# Patient Record
Sex: Female | Born: 1995 | Race: White | Hispanic: No | Marital: Single | Smoking: Current every day smoker
Health system: Southern US, Community
[De-identification: ages and names within clinical notes are randomized; demographics above are authoritative.]

## PROBLEM LIST (undated history)

## (undated) DIAGNOSIS — N83201 Unspecified ovarian cyst, right side: Secondary | ICD-10-CM

## (undated) HISTORY — PX: TONSILLECTOMY: SUR1361

---
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
## 2018-05-18 ENCOUNTER — Encounter (HOSPITAL_COMMUNITY): Payer: Self-pay | Admitting: Emergency Medicine

## 2018-05-18 ENCOUNTER — Emergency Department (HOSPITAL_COMMUNITY)
Admission: EM | Admit: 2018-05-18 | Discharge: 2018-05-19 | Disposition: A | Discharge status: Home / Self Care | Payer: Self-pay | Attending: Emergency Medicine | Admitting: Emergency Medicine

## 2018-05-18 ENCOUNTER — Other Ambulatory Visit: Payer: Self-pay

## 2018-05-18 DIAGNOSIS — F17210 Nicotine dependence, cigarettes, uncomplicated: Secondary | ICD-10-CM | POA: Insufficient documentation

## 2018-05-18 DIAGNOSIS — R112 Nausea with vomiting, unspecified: Secondary | ICD-10-CM | POA: Insufficient documentation

## 2018-05-18 LAB — CBC
HEMATOCRIT: 39.3 % (ref 36.0–46.0)
HEMOGLOBIN: 13.5 g/dL (ref 12.0–15.0)
MCH: 27.6 pg (ref 26.0–34.0)
MCHC: 34.4 g/dL (ref 30.0–36.0)
MCV: 80.4 fL (ref 78.0–100.0)
Platelets: 217 10*3/uL (ref 150–400)
RBC: 4.89 MIL/uL (ref 3.87–5.11)
RDW: 14.1 % (ref 11.5–15.5)
WBC: 7 10*3/uL (ref 4.0–10.5)

## 2018-05-18 LAB — URINALYSIS, ROUTINE W REFLEX MICROSCOPIC
Bilirubin Urine: NEGATIVE
GLUCOSE, UA: NEGATIVE mg/dL
HGB URINE DIPSTICK: NEGATIVE
Ketones, ur: NEGATIVE mg/dL
Leukocytes, UA: NEGATIVE
Nitrite: NEGATIVE
PH: 6 (ref 5.0–8.0)
Protein, ur: NEGATIVE mg/dL
SPECIFIC GRAVITY, URINE: 1.023 (ref 1.005–1.030)

## 2018-05-18 LAB — PREGNANCY, URINE: Preg Test, Ur: NEGATIVE

## 2018-05-18 MED ORDER — ONDANSETRON HCL 4 MG/2ML IJ SOLN
4.0000 mg | Freq: Once | INTRAMUSCULAR | Status: AC
  Administered 2018-05-18: 4 mg via INTRAVENOUS
  Filled 2018-05-18: qty 2

## 2018-05-18 MED ORDER — SODIUM CHLORIDE 0.9 % IV BOLUS
1000.0000 mL | Freq: Once | INTRAVENOUS | Status: AC
  Administered 2018-05-18: 1000 mL via INTRAVENOUS

## 2018-05-18 NOTE — ED Triage Notes (Signed)
Pt reports emesis and dry heaving.

## 2018-05-18 NOTE — ED Provider Notes (Signed)
[facility] EMERGENCY DEPARTMENT Provider Note   CSN: [id number] Arrival date & time: 05/18/18  2102     History   Chief Complaint Chief Complaint  Patient presents with  . Emesis    HPI Melanie Schwartz is a 22 y.o. female.  The history is provided by the patient. No language interpreter was used.  Emesis   This is a new problem. The current episode started yesterday. The problem occurs 2 to 4 times per day. The problem has not changed since onset.There has been no fever. Pertinent negatives include no chills.  Pt complains of vomiting x 2 days.  Pt reports no diarrhea.    History reviewed. No pertinent past medical history.  There are no active problems to display for this patient.   Past Surgical History:  Procedure Laterality Date  . TONSILLECTOMY       OB History   None      Home Medications    Prior to Admission medications   Not on File    Family History History reviewed. No pertinent family history.  Social History Social History   Tobacco Use  . Smoking status: Current Every Day Smoker    Packs/day: 0.50    Types: Cigarettes  . Smokeless tobacco: Never Used  Substance Use Topics  . Alcohol use: No  . Drug use: No     Allergies   Patient has no known allergies.   Review of Systems Review of Systems  Constitutional: Negative for chills.  Gastrointestinal: Positive for vomiting.  All other systems reviewed and are negative.    Physical Exam Updated Vital Signs BP 120/73 (BP Location: Right Arm)   Pulse 87   Temp 98.4 F (36.9 C) (Temporal)   Resp 18   Ht 5\' 7"  (1.702 m)   Wt 56.7 kg   LMP 04/22/2018   SpO2 99%   BMI 19.58 kg/m   Physical Exam  Constitutional: She is oriented to person, place, and time. She appears well-developed and well-nourished.  HENT:  Head: Normocephalic.  Eyes: EOM are normal.  Neck: Normal range of motion.  Cardiovascular: Normal rate and regular rhythm.  Pulmonary/Chest: Effort normal and  breath sounds normal.  Abdominal: She exhibits no distension. There is no tenderness. There is no guarding.  Musculoskeletal: Normal range of motion.  Neurological: She is alert and oriented to person, place, and time.  Skin: Skin is warm.  Psychiatric: She has a normal mood and affect.  Nursing note and vitals reviewed.    ED Treatments / Results  Labs (all labs ordered are listed, but only abnormal results are displayed) Labs Reviewed  CBC  URINALYSIS, ROUTINE W REFLEX MICROSCOPIC  PREGNANCY, URINE  LIPASE, BLOOD  COMPREHENSIVE METABOLIC PANEL    EKG None  Radiology No results found.  Procedures Procedures (including critical care time)  Medications Ordered in ED Medications  sodium chloride 0.9 % bolus 1,000 mL (1,000 mLs Intravenous New Bag/Given 05/18/18 2345)  ondansetron (ZOFRAN) injection 4 mg (4 mg Intravenous Given 05/18/18 2345)     Initial Impression / Assessment and Plan / ED Course  I have reviewed the triage vital signs and the nursing notes.  Pertinent labs & imaging results that were available during my care of the patient were reviewed by me and considered in my medical decision making (see chart for details).     MDM   Pt given Iv fluids and zofran.  Pt able to tolerate po fluids    Final Clinical Impressions(s) /  ED Diagnoses   Final diagnoses:  Non-intractable vomiting with nausea, unspecified vomiting type    ED Discharge Orders         Ordered    ondansetron (ZOFRAN ODT) 4 MG disintegrating tablet  Every 8 hours PRN     05/19/18 0002        An After Visit Summary was printed and given to the patient.    Elson Areas, PA-C 05/19/18 Ivor Reining    Glynn Octave, MD 05/19/18 0400

## 2018-05-19 LAB — COMPREHENSIVE METABOLIC PANEL
ALBUMIN: 4.2 g/dL (ref 3.5–5.0)
ALT: 11 U/L (ref 0–44)
AST: 16 U/L (ref 15–41)
Alkaline Phosphatase: 45 U/L (ref 38–126)
Anion gap: 6 (ref 5–15)
BUN: 10 mg/dL (ref 6–20)
CHLORIDE: 108 mmol/L (ref 98–111)
CO2: 26 mmol/L (ref 22–32)
CREATININE: 0.83 mg/dL (ref 0.44–1.00)
Calcium: 9.1 mg/dL (ref 8.9–10.3)
GFR calc Af Amer: >60 mL/min (ref >60)
GFR calc non Af Amer: >60 mL/min (ref >60)
Glucose, Bld: 100 mg/dL — ABNORMAL HIGH (ref 70–99)
Potassium: 3.6 mmol/L (ref 3.5–5.1)
Sodium: 140 mmol/L (ref 135–145)
Total Bilirubin: 0.6 mg/dL (ref 0.3–1.2)
Total Protein: 7.1 g/dL (ref 6.5–8.1)

## 2018-05-19 LAB — LIPASE, BLOOD: LIPASE: 27 U/L (ref 11–51)

## 2018-05-19 MED ORDER — ONDANSETRON 4 MG PO TBDP: 4.0000 mg | ORAL_TABLET | Freq: Three times a day (TID) | ORAL | 0 refills | Status: DC | PRN

## 2018-05-19 NOTE — Discharge Instructions (Signed)
Return if any problems.

## 2019-06-26 ENCOUNTER — Emergency Department (HOSPITAL_COMMUNITY): Payer: Self-pay

## 2019-06-26 ENCOUNTER — Emergency Department (HOSPITAL_COMMUNITY)
Admission: EM | Admit: 2019-06-26 | Discharge: 2019-06-26 | Disposition: A | Discharge status: Home / Self Care | Payer: Self-pay | Attending: Emergency Medicine | Admitting: Emergency Medicine

## 2019-06-26 ENCOUNTER — Other Ambulatory Visit: Payer: Self-pay

## 2019-06-26 DIAGNOSIS — N83201 Unspecified ovarian cyst, right side: Secondary | ICD-10-CM | POA: Insufficient documentation

## 2019-06-26 DIAGNOSIS — B9689 Other specified bacterial agents as the cause of diseases classified elsewhere: Secondary | ICD-10-CM

## 2019-06-26 DIAGNOSIS — N83209 Unspecified ovarian cyst, unspecified side: Secondary | ICD-10-CM

## 2019-06-26 DIAGNOSIS — N760 Acute vaginitis: Secondary | ICD-10-CM | POA: Insufficient documentation

## 2019-06-26 DIAGNOSIS — F17210 Nicotine dependence, cigarettes, uncomplicated: Secondary | ICD-10-CM | POA: Insufficient documentation

## 2019-06-26 DIAGNOSIS — R1031 Right lower quadrant pain: Secondary | ICD-10-CM

## 2019-06-26 LAB — CBC WITH DIFFERENTIAL/PLATELET
Abs Immature Granulocytes: 0.13 10*3/uL — ABNORMAL HIGH (ref 0.00–0.07)
Basophils Absolute: 0 10*3/uL (ref 0.0–0.1)
Basophils Relative: 0 %
Eosinophils Absolute: 0 10*3/uL (ref 0.0–0.5)
Eosinophils Relative: 0 %
HCT: 38.7 % (ref 36.0–46.0)
Hemoglobin: 12.7 g/dL (ref 12.0–15.0)
Immature Granulocytes: 1 %
Lymphocytes Relative: 12 %
Lymphs Abs: 1.5 10*3/uL (ref 0.7–4.0)
MCH: 26.6 pg (ref 26.0–34.0)
MCHC: 32.8 g/dL (ref 30.0–36.0)
MCV: 81 fL (ref 80.0–100.0)
Monocytes Absolute: 0.8 10*3/uL (ref 0.1–1.0)
Monocytes Relative: 6 %
Neutro Abs: 10.1 10*3/uL — ABNORMAL HIGH (ref 1.7–7.7)
Neutrophils Relative %: 81 %
Platelets: 296 10*3/uL (ref 150–400)
RBC: 4.78 MIL/uL (ref 3.87–5.11)
RDW: 13.8 % (ref 11.5–15.5)
WBC: 12.6 10*3/uL — ABNORMAL HIGH (ref 4.0–10.5)
nRBC: 0 % (ref 0.0–0.2)

## 2019-06-26 LAB — WET PREP, GENITAL
Sperm: NONE SEEN
Trich, Wet Prep: NONE SEEN
Yeast Wet Prep HPF POC: NONE SEEN

## 2019-06-26 LAB — COMPREHENSIVE METABOLIC PANEL
ALT: 18 U/L (ref 0–44)
AST: 22 U/L (ref 15–41)
Albumin: 4.7 g/dL (ref 3.5–5.0)
Alkaline Phosphatase: 42 U/L (ref 38–126)
Anion gap: 10 (ref 5–15)
BUN: 18 mg/dL (ref 6–20)
CO2: 24 mmol/L (ref 22–32)
Calcium: 9.2 mg/dL (ref 8.9–10.3)
Chloride: 106 mmol/L (ref 98–111)
Creatinine, Ser: 1.02 mg/dL — ABNORMAL HIGH (ref 0.44–1.00)
GFR calc Af Amer: >60 mL/min (ref >60)
GFR calc non Af Amer: >60 mL/min (ref >60)
Glucose, Bld: 113 mg/dL — ABNORMAL HIGH (ref 70–99)
Potassium: 3.3 mmol/L — ABNORMAL LOW (ref 3.5–5.1)
Sodium: 140 mmol/L (ref 135–145)
Total Bilirubin: 0.6 mg/dL (ref 0.3–1.2)
Total Protein: 7.5 g/dL (ref 6.5–8.1)

## 2019-06-26 LAB — HCG, QUANTITATIVE, PREGNANCY: hCG, Beta Chain, Quant, S: <1 m[IU]/mL (ref <5)

## 2019-06-26 LAB — POC URINE PREG, ED: Preg Test, Ur: NEGATIVE

## 2019-06-26 MED ORDER — METRONIDAZOLE 500 MG PO TABS: 500.0000 mg | ORAL_TABLET | Freq: Two times a day (BID) | ORAL | 0 refills | Status: AC

## 2019-06-26 MED ORDER — SODIUM CHLORIDE (PF) 0.9 % IJ SOLN
INTRAMUSCULAR | Status: AC
  Filled 2019-06-26: qty 50

## 2019-06-26 MED ORDER — FENTANYL CITRATE (PF) 100 MCG/2ML IJ SOLN
75.0000 ug | Freq: Once | INTRAMUSCULAR | Status: AC
  Administered 2019-06-26: 12:00:00 75 ug via INTRAVENOUS
  Filled 2019-06-26: qty 2

## 2019-06-26 MED ORDER — SODIUM CHLORIDE 0.9 % IV BOLUS
1000.0000 mL | Freq: Once | INTRAVENOUS | Status: AC
  Administered 2019-06-26: 1000 mL via INTRAVENOUS

## 2019-06-26 MED ORDER — IOHEXOL 300 MG/ML  SOLN
100.0000 mL | Freq: Once | INTRAMUSCULAR | Status: AC | PRN
  Administered 2019-06-26: 14:00:00 100 mL via INTRAVENOUS

## 2019-06-26 MED ORDER — HYDROMORPHONE HCL 1 MG/ML IJ SOLN
1.0000 mg | Freq: Once | INTRAMUSCULAR | Status: AC
  Administered 2019-06-26: 11:00:00 1 mg via INTRAVENOUS
  Filled 2019-06-26: qty 1

## 2019-06-26 MED ORDER — IBUPROFEN 600 MG PO TABS: 600.0000 mg | ORAL_TABLET | Freq: Four times a day (QID) | ORAL | 0 refills | Status: AC

## 2019-06-26 NOTE — ED Triage Notes (Addendum)
Pt BIBA from home.   Per EMS- Pt reports waking suddenly to sharp RLQ abdominal pain.  Pt reports cloudy urine.  Pt screaming, restless, reporting that she cant urinate at time of registration in ED.  Ambulatory.

## 2019-06-26 NOTE — ED Provider Notes (Signed)
[facility]-EMERGENCY DEPT Provider Note   CSN: [id number] Arrival date & time: 06/26/19  1660     History   Chief Complaint Chief Complaint  Patient presents with   Abdominal Pain    HPI Melanie Schwartz is a 23 y.o. female with no significant past medical history presenting to the emergency department abdominal pain.  She reports that she woke up with acute abdominal pain in her right lower quadrant and pelvic region around 9 AM.  The pain is 10 out of 10.  It is located in her right lower abdomen it does not radiate anywhere.  It is sharp and severe.  She reports some nausea and vomiting.  She reports some vaginal discharge.  She denies any fevers or chills.  She denies any history of abdominal surgery.  She reports she cannot remember the last time she had a bowel movement.  She denies any other medical problems says she does not take any medications.   HPI  No past medical history on file.  There are no active problems to display for this patient.   Past Surgical History:  Procedure Laterality Date   TONSILLECTOMY       OB History   No obstetric history on file.      Home Medications    Prior to Admission medications   Medication Sig Start Date End Date Taking? Authorizing Provider  ibuprofen (ADVIL) 600 MG tablet Take 1 tablet (600 mg total) by mouth every 6 (six) hours for 7 days. 06/26/19 07/03/19  Terald Sleeper, MD  metroNIDAZOLE (FLAGYL) 500 MG tablet Take 1 tablet (500 mg total) by mouth 2 (two) times daily for 7 days. Do NOT drink alcohol while taking this medication! 06/26/19 07/03/19  Terald Sleeper, MD    Family History No family history on file.  Social History Social History   Tobacco Use   Smoking status: Current Every Day Smoker    Packs/day: 0.50    Types: Cigarettes   Smokeless tobacco: Never Used  Substance Use Topics   Alcohol use: No   Drug use: No     Allergies   Patient has no known  allergies.   Review of Systems Review of Systems  Constitutional: Negative for chills and fever.  Eyes: Negative for photophobia and visual disturbance.  Respiratory: Negative for cough and shortness of breath.   Cardiovascular: Negative for palpitations and leg swelling.  Gastrointestinal: Positive for abdominal pain, nausea and vomiting. Negative for blood in stool, constipation and diarrhea.  Genitourinary: Positive for vaginal discharge. Negative for dysuria, genital sores, hematuria and vaginal bleeding.  Musculoskeletal: Negative for arthralgias and back pain.  Skin: Negative for pallor and rash.  Neurological: Negative for syncope and light-headedness.  All other systems reviewed and are negative.    Physical Exam Updated Vital Signs BP 95/60 (BP Location: Right Arm)    Pulse 65    Temp 97.9 F (36.6 C) (Oral)    Resp 18    LMP 06/21/2019 (Approximate) Comment: neg preg test 06/26/2019   SpO2 99%   Physical Exam Vitals signs and nursing note reviewed.  Constitutional:      General: She is in acute distress.     Appearance: She is well-developed.     Comments: Sobbing, writhing on bed  HENT:     Head: Normocephalic and atraumatic.  Eyes:     Conjunctiva/sclera: Conjunctivae normal.  Neck:     Musculoskeletal: Neck supple.  Cardiovascular:  Rate and Rhythm: Normal rate and regular rhythm.     Heart sounds: No murmur.  Pulmonary:     Effort: Pulmonary effort is normal. No respiratory distress.     Breath sounds: Normal breath sounds.  Abdominal:     Palpations: Abdomen is soft.     Tenderness: There is abdominal tenderness (mild) in the right lower quadrant. There is no right CVA tenderness, left CVA tenderness, guarding or rebound. Positive signs include McBurney's sign. Negative signs include Murphy's sign and Rovsing's sign.  Genitourinary:    Comments: See ed course Skin:    General: Skin is warm and dry.  Neurological:     Mental Status: She is alert.       ED Treatments / Results  Labs (all labs ordered are listed, but only abnormal results are displayed) Labs Reviewed  WET PREP, GENITAL - Abnormal; Notable for the following components:      Result Value   Clue Cells Wet Prep HPF POC PRESENT (*)    WBC, Wet Prep HPF POC MANY (*)    All other components within normal limits  COMPREHENSIVE METABOLIC PANEL - Abnormal; Notable for the following components:   Potassium 3.3 (*)    Glucose, Bld 113 (*)    Creatinine, Ser 1.02 (*)    All other components within normal limits  CBC WITH DIFFERENTIAL/PLATELET - Abnormal; Notable for the following components:   WBC 12.6 (*)    Neutro Abs 10.1 (*)    Abs Immature Granulocytes 0.13 (*)    All other components within normal limits  HCG, QUANTITATIVE, PREGNANCY  URINALYSIS, ROUTINE W REFLEX MICROSCOPIC  URINALYSIS, ROUTINE W REFLEX MICROSCOPIC  POC URINE PREG, ED  POC URINE PREG, ED    EKG None  Radiology US Transvaginal Non-ob  Result Date: 06/26/2019 CLINICAL DATA:  Right-sided pain. EXAM: TRANSABDOMINAL AND TRANSVAGINAL ULTRASOUND OF PELVIS DOPPLER ULTRASOUND OF OVARIES TECHNIQUE: Both transabdominal and transvaginal ultrasound examinations of the pelvis were performed. Transabdominal technique was performed for global imaging of the pelvis including uterus, ovaries, adnexal regions, and pelvic cul-de-sac. It was necessary to proceed with endovaginal exam following the transabdominal exam to visualize the endometrium and ovaries. Color and duplex Doppler ultrasound was utilized to evaluate blood flow to the ovaries. COMPARISON:  None. FINDINGS: Uterus Measurements: 7.5 x 3.7 x 4.7 cm = volume: 67.3 mL. No fibroids or other mass visualized. Endometrium Thickness: 7.6 mm.  No focal abnormality visualized. Right ovary Measurements: 2.8 x 2.6 x 2.6 cm = volume: 10.1 mL. Contains a dominant follicle measuring 1.7 cm. Left ovary Measurements: 2.7 x 1.8 x 1.9 cm = volume: 4.7 mL. Normal  appearance/no adnexal mass. Pulsed Doppler evaluation of both ovaries demonstrates normal low-resistance arterial and venous waveforms. Other findings A small amount of physiologic fluid is seen in the right adnexa. IMPRESSION: 1. Dominant follicle measuring 1.7 cm in the right ovary. A small amount of fluid in the right adnexa is likely physiologic, probably from a ruptured follicle. No torsion or other abnormality noted. Electronically Signed   By: Gerome Sam III M.D   On: 06/26/2019 13:11   US Pelvis Complete  Result Date: 06/26/2019 CLINICAL DATA:  Right-sided pain. EXAM: TRANSABDOMINAL AND TRANSVAGINAL ULTRASOUND OF PELVIS DOPPLER ULTRASOUND OF OVARIES TECHNIQUE: Both transabdominal and transvaginal ultrasound examinations of the pelvis were performed. Transabdominal technique was performed for global imaging of the pelvis including uterus, ovaries, adnexal regions, and pelvic cul-de-sac. It was necessary to proceed with endovaginal exam following the transabdominal exam  to visualize the endometrium and ovaries. Color and duplex Doppler ultrasound was utilized to evaluate blood flow to the ovaries. COMPARISON:  None. FINDINGS: Uterus Measurements: 7.5 x 3.7 x 4.7 cm = volume: 67.3 mL. No fibroids or other mass visualized. Endometrium Thickness: 7.6 mm.  No focal abnormality visualized. Right ovary Measurements: 2.8 x 2.6 x 2.6 cm = volume: 10.1 mL. Contains a dominant follicle measuring 1.7 cm. Left ovary Measurements: 2.7 x 1.8 x 1.9 cm = volume: 4.7 mL. Normal appearance/no adnexal mass. Pulsed Doppler evaluation of both ovaries demonstrates normal low-resistance arterial and venous waveforms. Other findings A small amount of physiologic fluid is seen in the right adnexa. IMPRESSION: 1. Dominant follicle measuring 1.7 cm in the right ovary. A small amount of fluid in the right adnexa is likely physiologic, probably from a ruptured follicle. No torsion or other abnormality noted. Electronically  Signed   By: Dorise Bullion III M.D   On: 06/26/2019 13:11   Ct Abdomen Pelvis W Contrast  Result Date: 06/26/2019 CLINICAL DATA:  Right lower quadrant tenderness EXAM: CT ABDOMEN AND PELVIS WITH CONTRAST TECHNIQUE: Multidetector CT imaging of the abdomen and pelvis was performed using the standard protocol following bolus administration of intravenous contrast. CONTRAST:  124mL OMNIPAQUE IOHEXOL 300 MG/ML  SOLN COMPARISON:  Abdominal radiograph dated 01/18/2010. FINDINGS: Lower chest: No acute abnormality. Hepatobiliary: No focal liver abnormality is seen. No gallstones, gallbladder wall thickening, or biliary dilatation. Pancreas: Unremarkable. No pancreatic ductal dilatation or surrounding inflammatory changes. Spleen: Normal in size without focal abnormality. Adrenals/Urinary Tract: Adrenal glands are unremarkable. Kidneys are normal, without renal calculi, focal lesion, or hydronephrosis. Bladder is unremarkable. Stomach/Bowel: Stomach is within normal limits. Appendix appears normal. No evidence of bowel wall thickening, distention, or inflammatory changes. Vascular/Lymphatic: No significant vascular findings are present. No enlarged abdominal or pelvic lymph nodes. Reproductive: Uterus and bilateral adnexa are unremarkable. Other: No abdominal wall hernia or abnormality. No abdominopelvic ascites. Musculoskeletal: No acute or significant osseous findings. IMPRESSION: No acute process in the abdomen or pelvis. Electronically Signed   By: Zerita Boers M.D.   On: 06/26/2019 14:46   [place] Art/ven Flow Abd Pelv Doppler  Result Date: 06/26/2019 CLINICAL DATA:  Right-sided pain. EXAM: TRANSABDOMINAL AND TRANSVAGINAL ULTRASOUND OF PELVIS DOPPLER ULTRASOUND OF OVARIES TECHNIQUE: Both transabdominal and transvaginal ultrasound examinations of the pelvis were performed. Transabdominal technique was performed for global imaging of the pelvis including uterus, ovaries, adnexal regions, and pelvic cul-de-sac. It  was necessary to proceed with endovaginal exam following the transabdominal exam to visualize the endometrium and ovaries. Color and duplex Doppler ultrasound was utilized to evaluate blood flow to the ovaries. COMPARISON:  None. FINDINGS: Uterus Measurements: 7.5 x 3.7 x 4.7 cm = volume: 67.3 mL. No fibroids or other mass visualized. Endometrium Thickness: 7.6 mm.  No focal abnormality visualized. Right ovary Measurements: 2.8 x 2.6 x 2.6 cm = volume: 10.1 mL. Contains a dominant follicle measuring 1.7 cm. Left ovary Measurements: 2.7 x 1.8 x 1.9 cm = volume: 4.7 mL. Normal appearance/no adnexal mass. Pulsed Doppler evaluation of both ovaries demonstrates normal low-resistance arterial and venous waveforms. Other findings A small amount of physiologic fluid is seen in the right adnexa. IMPRESSION: 1. Dominant follicle measuring 1.7 cm in the right ovary. A small amount of fluid in the right adnexa is likely physiologic, probably from a ruptured follicle. No torsion or other abnormality noted. Electronically Signed   By: Dorise Bullion III M.D   On: 06/26/2019 13:11  Procedures Procedures (including critical care time)  Medications Ordered in ED Medications  sodium chloride (PF) 0.9 % injection (has no administration in time range)  HYDROmorphone (DILAUDID) injection 1 mg (1 mg Intravenous Given 06/26/19 1043)  sodium chloride 0.9 % bolus 1,000 mL (0 mLs Intravenous Stopped 06/26/19 1423)  fentaNYL (SUBLIMAZE) injection 75 mcg (75 mcg Intravenous Given 06/26/19 1224)  iohexol (OMNIPAQUE) 300 MG/ML solution 100 mL (100 mLs Intravenous Contrast Given 06/26/19 1424)     Initial Impression / Assessment and Plan / ED Course  I have reviewed the triage vital signs and the nursing notes.  Pertinent labs & imaging results that were available during my care of the patient were reviewed by me and considered in my medical decision making (see chart for details).  23 yo female presenting with abrupt  onset RLQ abdominal pain that woke her from sleep this morning.  Stabbing, severe pain per her description.  + nausea and vomiting at home.  She is hysterical on arrival I was able to calm her enough for a reliable abdominal exam.  She does have some vague tenderness with deep palpation in the RLQ near McBurney's Point, but no rebound, guarding, or distension of the abdomen.  I do not suspect a bowel perforation, but with her tenderness and GI symptoms, appendicitis is on the differential.  More concerning is the possibility of ovarian torsion or ectopic pregnancy.  We will check pregnancy status.  I will perform a pelvic exam as soon as pain medication can be given, as the patient is refusing exam at this time due to pain.  She will need pain medication for pelvic doppler ultrasound as well.  Her vitals are otherwise stable.  I do not suspect significant blood loss.  Labs pending, will reassess.  Clinical Course as of Jun 25 1858  Mon Jun 26, 2019  1002 Patient not in the room for my initial evaluation, will return   [MT]  1134 Pelvic exam performed with female chaperone present in the room.  There is minimal physiological discharge in the vaginal vault.  No malodorous or mucoid type discharge.  No erythema of the cervix.  On digital exam there is no significant cervical motion tenderness.  There is no adnexal tenderness on my exam.  There is no vaginal bleeding.  There were no external vaginal lesions.   [MT]  1135 We will give additional IV pain medications the patient continues to be quite uncomfortable.  Also ordered a stat Doppler ultrasound.   [MT]  1135 Patient was able to provide a very small amount of urine and I asked the nurses to try to perform point-of-care urine pregnancy test.   [MT]  1155 Preg Test, Ur: NEGATIVE [MT]  1319 Normal flow noted on U/S, right sided 1.7 cm ovarian cyst, possible rupture.  Given degree of pain and minor leukocytosis, I believe it is reasonable to  obtain CT abdomen pelvis to evaluate for appendicitis at this time.   [MT]  1508 Unremarkable CT abdomenv pelvis   [MT]  1541 Patient feeling significantly better.  With a negative work-up here, believe this most likely a ruptured cyst.  Explained her to cannot definitively rule out ovarian torsion, however at this time believe is reasonable to discharge her.  She has severe recurrence of her symptoms she should come back into the ER.  We will also treat her for BV.  Also give her an OB/GYN referral.  She verbalizes understanding and is comfortable going home.   [  MT]    Clinical Course User Index [MT] Terald Sleeperrifan, Maddoxx Burkitt J, MD     Final Clinical Impressions(s) / ED Diagnoses   Final diagnoses:  Ruptured ovarian cyst  Right lower quadrant abdominal pain  BV (bacterial vaginosis)    ED Discharge Orders         Ordered    ibuprofen (ADVIL) 600 MG tablet  Every 6 hours     06/26/19 1552    metroNIDAZOLE (FLAGYL) 500 MG tablet  2 times daily     06/26/19 1552           Terald Sleeperrifan, Icey Tello J, MD 06/26/19 1859

## 2019-06-26 NOTE — Discharge Instructions (Signed)
Do NOT drink alcohol while taking Flagyl!  It will make you very sick.

## 2019-06-26 NOTE — ED Notes (Signed)
This writer attempted to bladder scan patient, patient in too much pain to hold still long enough for the bladder scanner to read. Will attempt again after pain medicine is given.

## 2019-09-25 ENCOUNTER — Emergency Department (HOSPITAL_COMMUNITY): Payer: Self-pay

## 2019-09-25 ENCOUNTER — Encounter (HOSPITAL_COMMUNITY): Payer: Self-pay | Admitting: Emergency Medicine

## 2019-09-25 ENCOUNTER — Emergency Department (HOSPITAL_COMMUNITY)
Admission: EM | Admit: 2019-09-25 | Discharge: 2019-09-25 | Disposition: A | Discharge status: Home / Self Care | Payer: Self-pay | Attending: Emergency Medicine | Admitting: Emergency Medicine

## 2019-09-25 ENCOUNTER — Other Ambulatory Visit: Payer: Self-pay

## 2019-09-25 DIAGNOSIS — R1031 Right lower quadrant pain: Secondary | ICD-10-CM | POA: Insufficient documentation

## 2019-09-25 DIAGNOSIS — R110 Nausea: Secondary | ICD-10-CM | POA: Insufficient documentation

## 2019-09-25 DIAGNOSIS — N83201 Unspecified ovarian cyst, right side: Secondary | ICD-10-CM | POA: Insufficient documentation

## 2019-09-25 DIAGNOSIS — N83202 Unspecified ovarian cyst, left side: Secondary | ICD-10-CM | POA: Insufficient documentation

## 2019-09-25 DIAGNOSIS — F17210 Nicotine dependence, cigarettes, uncomplicated: Secondary | ICD-10-CM | POA: Insufficient documentation

## 2019-09-25 DIAGNOSIS — R102 Pelvic and perineal pain: Secondary | ICD-10-CM | POA: Insufficient documentation

## 2019-09-25 HISTORY — DX: Unspecified ovarian cyst, right side: N83.201

## 2019-09-25 LAB — COMPREHENSIVE METABOLIC PANEL
ALT: 44 U/L (ref 0–44)
AST: 34 U/L (ref 15–41)
Albumin: 4.2 g/dL (ref 3.5–5.0)
Alkaline Phosphatase: 39 U/L (ref 38–126)
Anion gap: 8 (ref 5–15)
BUN: 19 mg/dL (ref 6–20)
CO2: 27 mmol/L (ref 22–32)
Calcium: 9.2 mg/dL (ref 8.9–10.3)
Chloride: 105 mmol/L (ref 98–111)
Creatinine, Ser: 1.14 mg/dL — ABNORMAL HIGH (ref 0.44–1.00)
GFR calc Af Amer: >60 mL/min (ref >60)
GFR calc non Af Amer: >60 mL/min (ref >60)
Glucose, Bld: 94 mg/dL (ref 70–99)
Potassium: 3.7 mmol/L (ref 3.5–5.1)
Sodium: 140 mmol/L (ref 135–145)
Total Bilirubin: 0.5 mg/dL (ref 0.3–1.2)
Total Protein: 7 g/dL (ref 6.5–8.1)

## 2019-09-25 LAB — CBC WITH DIFFERENTIAL/PLATELET
Abs Immature Granulocytes: 0.01 10*3/uL (ref 0.00–0.07)
Basophils Absolute: 0 10*3/uL (ref 0.0–0.1)
Basophils Relative: 0 %
Eosinophils Absolute: 0.1 10*3/uL (ref 0.0–0.5)
Eosinophils Relative: 1 %
HCT: 40.3 % (ref 36.0–46.0)
Hemoglobin: 13.1 g/dL (ref 12.0–15.0)
Immature Granulocytes: 0 %
Lymphocytes Relative: 23 %
Lymphs Abs: 1.6 10*3/uL (ref 0.7–4.0)
MCH: 26 pg (ref 26.0–34.0)
MCHC: 32.5 g/dL (ref 30.0–36.0)
MCV: 80 fL (ref 80.0–100.0)
Monocytes Absolute: 0.5 10*3/uL (ref 0.1–1.0)
Monocytes Relative: 7 %
Neutro Abs: 5 10*3/uL (ref 1.7–7.7)
Neutrophils Relative %: 69 %
Platelets: 291 10*3/uL (ref 150–400)
RBC: 5.04 MIL/uL (ref 3.87–5.11)
RDW: 13.2 % (ref 11.5–15.5)
WBC: 7.2 10*3/uL (ref 4.0–10.5)
nRBC: 0 % (ref 0.0–0.2)

## 2019-09-25 LAB — URINALYSIS, ROUTINE W REFLEX MICROSCOPIC
Bilirubin Urine: NEGATIVE
Glucose, UA: NEGATIVE mg/dL
Hgb urine dipstick: NEGATIVE
Ketones, ur: NEGATIVE mg/dL
Leukocytes,Ua: NEGATIVE
Nitrite: NEGATIVE
Protein, ur: NEGATIVE mg/dL
Specific Gravity, Urine: 1.026 (ref 1.005–1.030)
pH: 6 (ref 5.0–8.0)

## 2019-09-25 LAB — PREGNANCY, URINE: Preg Test, Ur: NEGATIVE

## 2019-09-25 LAB — LIPASE, BLOOD: Lipase: 23 U/L (ref 11–51)

## 2019-09-25 MED ORDER — MORPHINE SULFATE (PF) 4 MG/ML IV SOLN
4.0000 mg | Freq: Once | INTRAVENOUS | Status: AC
  Administered 2019-09-25: 4 mg via INTRAVENOUS
  Filled 2019-09-25: qty 1

## 2019-09-25 MED ORDER — IOHEXOL 300 MG/ML  SOLN
100.0000 mL | Freq: Once | INTRAMUSCULAR | Status: AC | PRN
  Administered 2019-09-25: 100 mL via INTRAVENOUS

## 2019-09-25 MED ORDER — KETOROLAC TROMETHAMINE 30 MG/ML IJ SOLN
30.0000 mg | Freq: Once | INTRAMUSCULAR | Status: AC
  Administered 2019-09-25: 30 mg via INTRAVENOUS
  Filled 2019-09-25: qty 1

## 2019-09-25 MED ORDER — ONDANSETRON HCL 4 MG/2ML IJ SOLN
4.0000 mg | Freq: Once | INTRAMUSCULAR | Status: AC
  Administered 2019-09-25: 10:00:00 4 mg via INTRAVENOUS
  Filled 2019-09-25: qty 2

## 2019-09-25 MED ORDER — SODIUM CHLORIDE 0.9 % IV BOLUS
1000.0000 mL | Freq: Once | INTRAVENOUS | Status: AC
  Administered 2019-09-25: 1000 mL via INTRAVENOUS

## 2019-09-25 MED ORDER — FENTANYL CITRATE (PF) 100 MCG/2ML IJ SOLN
50.0000 ug | Freq: Once | INTRAMUSCULAR | Status: AC
  Administered 2019-09-25: 50 ug via INTRAVENOUS
  Filled 2019-09-25: qty 2

## 2019-09-25 MED ORDER — SODIUM CHLORIDE 0.9 % IV BOLUS
500.0000 mL | Freq: Once | INTRAVENOUS | Status: AC
  Administered 2019-09-25: 13:00:00 500 mL via INTRAVENOUS

## 2019-09-25 NOTE — ED Notes (Addendum)
Received report from [employer].pt up to provide urine sample and pt now moaning in pain. edp aware.

## 2019-09-25 NOTE — ED Provider Notes (Signed)
[facility] EMERGENCY DEPARTMENT Provider Note   CSN: [id number] Arrival date & time: 09/25/19  1517     History Chief Complaint  Patient presents with  . Pelvic Pain    Melanie Schwartz is a 24 y.o. female.  Melanie Schwartz is a 24 y.o. female, with a history of ovarian cyst, who presents to the ED for evaluation of right-sided pelvic pain.  Patient states the pain began suddenly last night while she was sitting on the couch.  She reports since then pain has been constant, described as sharp and stabbing.  She states this pain feels very similar to when she was previously diagnosed with a right-sided ovarian cyst that had ruptured.  She states that she just recently finished her menstrual cycle which was normal.  She denies any associated vaginal discharge, states she is only sexually active with women.  No concern that she could be pregnant.  She reports some dysuria that just began last night, no associated flank pain or hematuria.  She denies any upper abdominal pain.  States with onset of severe pain she became nauseous but has not had any vomiting.  No diarrhea or constipation.  No fevers or chills.  States that when she was recently seen and diagnosed with ovarian cyst she was referred to gynecology but has not yet been able to follow-up.  She is not taken anything for her pain prior to arrival.  No other aggravating or alleviating factors.        Past Medical History:  Diagnosis Date  . Bilateral ovarian cysts     There are no problems to display for this patient.   Past Surgical History:  Procedure Laterality Date  . TONSILLECTOMY       OB History    Gravida  0   Para  0   Term  0   Preterm  0   AB  0   Living  0     SAB  0   TAB  0   Ectopic  0   Multiple  0   Live Births  0           History reviewed. No pertinent family history.  Social History   Tobacco Use  . Smoking status: Current Every Day Smoker    Packs/day: 0.50    Types:  Cigarettes  . Smokeless tobacco: Never Used  Substance Use Topics  . Alcohol use: No  . Drug use: No    Home Medications Prior to Admission medications   Not on File    Allergies    Patient has no known allergies.  Review of Systems   Review of Systems  Constitutional: Negative for chills and fever.  Respiratory: Negative for cough and shortness of breath.   Cardiovascular: Negative for chest pain.  Gastrointestinal: Positive for abdominal pain and nausea. Negative for diarrhea and vomiting.  Genitourinary: Positive for pelvic pain. Negative for dysuria, frequency, vaginal bleeding and vaginal discharge.  Musculoskeletal: Negative for arthralgias, back pain and myalgias.  Skin: Negative for color change and rash.  Neurological: Negative for dizziness, syncope and light-headedness.  All other systems reviewed and are negative.   Physical Exam Updated Vital Signs BP 104/74   Pulse (!) 84   Temp 98.3 F (36.8 C) (Oral)   Resp 17   Ht 5\' 6"  (1.676 m)   Wt 63.5 kg   LMP 09/20/2019   SpO2 100%   BMI 22.60 kg/m   Physical Exam Vitals and  nursing note reviewed.  Constitutional:      General: She is not in acute distress.    Appearance: Normal appearance. She is well-developed and normal weight. She is not ill-appearing or diaphoretic.  HENT:     Head: Normocephalic and atraumatic.     Mouth/Throat:     Mouth: Mucous membranes are moist.     Pharynx: Oropharynx is clear.  Eyes:     General:        Right eye: No discharge.        Left eye: No discharge.     Pupils: Pupils are equal, round, and reactive to light.  Cardiovascular:     Rate and Rhythm: Normal rate and regular rhythm.     Heart sounds: Normal heart sounds.  Pulmonary:     Effort: Pulmonary effort is normal. No respiratory distress.     Breath sounds: Normal breath sounds. No wheezing or rales.  Abdominal:     General: Bowel sounds are normal. There is no distension.     Palpations: Abdomen is soft.  There is no mass.     Tenderness: There is abdominal tenderness. There is no guarding.     Comments: Abdomen soft, nondistended, bowel sounds present throughout, tender in the right lower quadrant without guarding or rebound tenderness.  Genitourinary:    Comments: Pelvic exam deferred by patient. Musculoskeletal:        General: No deformity.     Cervical back: Neck supple.  Skin:    General: Skin is warm and dry.     Capillary Refill: Capillary refill takes less than 2 seconds.  Neurological:     Mental Status: She is alert.     Coordination: Coordination normal.     Comments: Speech is clear, able to follow commands Moves extremities without ataxia, coordination intact  Psychiatric:        Mood and Affect: Mood normal.        Behavior: Behavior normal.     ED Results / Procedures / Treatments   Labs (all labs ordered are listed, but only abnormal results are displayed) Labs Reviewed  COMPREHENSIVE METABOLIC PANEL - Abnormal; Notable for the following components:      Result Value   Creatinine, Ser 1.14 (*)    All other components within normal limits  CBC WITH DIFFERENTIAL/PLATELET  LIPASE, BLOOD  PREGNANCY, URINE  URINALYSIS, ROUTINE W REFLEX MICROSCOPIC    EKG None  Radiology CT ABDOMEN PELVIS W CONTRAST  Result Date: 09/25/2019 CLINICAL DATA:  Right lower quadrant pain. Appendicitis suspected. Symptoms beginning 2 days ago. EXAM: CT ABDOMEN AND PELVIS WITH CONTRAST TECHNIQUE: Multidetector CT imaging of the abdomen and pelvis was performed using the standard protocol following bolus administration of intravenous contrast. CONTRAST:  OMNIPAQUE IOHEXOL 300 MG/ML  SOLN COMPARISON:  Pelvic ultrasound of earlier today. Abdominopelvic CT 06/26/2019. FINDINGS: Lower chest: Clear lung bases. Normal heart size without pericardial or pleural effusion. Hepatobiliary: Focal steatosis adjacent the falciform ligament. Normal gallbladder, without biliary ductal dilatation.  Pancreas: Normal, without mass or ductal dilatation. Spleen: Normal in size, without focal abnormality. Adrenals/Urinary Tract: Normal adrenal glands. Too small to characterize interpolar left renal lesion. Normal right kidney, without hydronephrosis. Normal urinary bladder. Stomach/Bowel: Normal stomach, without wall thickening. Normal colon and terminal ileum. Normal appendix, including on 58/2. Normal small bowel. Vascular/Lymphatic: Normal caliber of the aorta and branch vessels. No abdominopelvic adenopathy. Reproductive: Normal uterus and adnexa. Other: Trace free pelvic fluid is likely physiologic. No free intraperitoneal air.  Musculoskeletal: No acute osseous abnormality. IMPRESSION: No acute process or explanation for right lower quadrant pain. Electronically Signed   By: Jeronimo Greaves M.D.   On: 09/25/2019 12:57   US PELVIC COMPLETE W TRANSVAGINAL AND TORSION R/O  Result Date: 09/25/2019 CLINICAL DATA:  Right-sided pelvic pain with history of ovarian cysts, rule out torsion EXAM: TRANSABDOMINAL AND TRANSVAGINAL ULTRASOUND OF PELVIS DOPPLER ULTRASOUND OF OVARIES TECHNIQUE: Both transabdominal and transvaginal ultrasound examinations of the pelvis were performed. Transabdominal technique was performed for global imaging of the pelvis including uterus, ovaries, adnexal regions, and pelvic cul-de-sac. It was necessary to proceed with endovaginal exam following the transabdominal exam to visualize the ovaries. Color and duplex Doppler ultrasound was utilized to evaluate blood flow to the ovaries. COMPARISON:  06/26/2019 FINDINGS: Uterus Measurements: 8 x 3 x 4 cm = volume: 55 mL. No fibroids or other mass visualized. Endometrium Thickness: 2 mm.  No focal abnormality visualized. Right ovary Measurements: 31 x 25 x 18 mm = volume: 7.4 mL. Normal appearance/no adnexal mass. Left ovary Measurements: 26 x 23 x 16 mm = volume: 4.8 mL. Normal appearance/no adnexal mass. Pulsed Doppler evaluation of both ovaries  demonstrates normal low-resistance arterial and venous waveforms. Other findings No abnormal free fluid. IMPRESSION: Negative pelvic ultrasound.  No explanation for pain. Electronically Signed   By: Marnee Spring M.D.   On: 09/25/2019 10:51    Procedures Procedures (including critical care time)  Medications Ordered in ED Medications  ondansetron (ZOFRAN) injection 4 mg (4 mg Intravenous Given 09/25/19 1003)  sodium chloride 0.9 % bolus 1,000 mL (0 mLs Intravenous Stopped 09/25/19 1302)  morphine 4 MG/ML injection 4 mg (4 mg Intravenous Given 09/25/19 1003)  fentaNYL (SUBLIMAZE) injection 50 mcg (50 mcg Intravenous Given 09/25/19 1136)  sodium chloride 0.9 % bolus 500 mL (500 mLs Intravenous New Bag/Given 09/25/19 1302)  iohexol (OMNIPAQUE) 300 MG/ML solution 100 mL (100 mLs Intravenous Contrast Given 09/25/19 1239)  ketorolac (TORADOL) 30 MG/ML injection 30 mg (30 mg Intravenous Given 09/25/19 1355)    ED Course  I have reviewed the triage vital signs and the nursing notes.  Pertinent labs & imaging results that were available during my care of the patient were reviewed by me and considered in my medical decision making (see chart for details).  Clinical Course as of Sep 24 1398  Mon Sep 25, 2019  4339 24 year old female with history of ovarian cyst presents with right-sided pelvic pain that started suddenly last night.  She reports pain has been constant, she has had some associated nausea.  No fevers, no abnormal bowel movements.  Denies vaginal bleeding or discharge.  Concern for recurrence of ovarian cyst, will get ultrasound to rule out torsion as well as basic lab work.  On exam she has focal tenderness in the right lower quadrant.   [KF]  1047 Lab work is reassuring, no leukocytosis, slight bump in creatinine but no electrolyte derangements, normal liver function and lipase.  Urinalysis and urine pregnancy pending   [KF]  1051 Ultrasound shows no evidence of ovarian cysts or other  acute abnormalities, patient continues to report severe right lower quadrant pain and appears uncomfortable, will get CT to rule out appendicitis or other acute pathology.  US PELVIC COMPLETE W TRANSVAGINAL AND TORSION R/O [KF]  1200 Urinalysis without signs of infection, no hematuria to suggest renal stone.  Negative pregnancy  Urinalysis, Routine w reflex microscopic [KF]  1257 CT abdomen pelvis with no evidence of appendicitis or other acute  abnormality to explain patient's pain.  She reports improvement but still has some discomfort, given that this pain came immediately after her menstrual cycle, question whether it may be related to endometriosis.  CT ABDOMEN PELVIS W CONTRAST [KF]    Clinical Course User Index [KF] Janet Berlin   MDM Rules/Calculators/A&P                      24 year old female with sudden onset right lower quadrant pain that began last night, history of ovarian cyst and this feels similar but ultrasound today shows no evidence of ovarian cyst or other pelvic pathology.  She is not sexually active with men and experiencing vaginal discharge recently had negative STD testing and I have low suspicion for PID.  Urinalysis does not show evidence of infection, CT scan obtained to rule out appendicitis or other acute intra-abdominal pathology which was also reassuring.  Unclear etiology of patient's pain given that it is associated with her menstrual cycle question whether it may be related to endometriosis or recently ruptured cyst.  Will have patient follow-up with gynecology.  She expresses understanding and agreement.  Discharged home in good condition.  Final Clinical Impression(s) / ED Diagnoses Final diagnoses:  RLQ abdominal pain  Pelvic pain in female    Rx / DC Orders ED Discharge Orders    None       Janet Berlin 09/25/19 1411    Noemi Chapel, MD 09/25/19 1740

## 2019-09-25 NOTE — ED Notes (Signed)
Pt on cell phone. States pain is better raitng . Waiting on ct. nad

## 2019-09-25 NOTE — ED Triage Notes (Signed)
PT states right sided pelvic pain reoccurring starting x2 days ago. PT states had menstrual cycle starting on 09/20/2019 and last month diagnosed with ovarian cysts. PT states she was referred to GYN but hasn't been able to follow up.

## 2019-09-25 NOTE — Discharge Instructions (Signed)
Your work-up today is reassuring does not show an ovarian cyst or any other abnormality on your CT to explain your pain.  Question whether he may have had a recently ruptured cyst, or if you could have endometriosis contributing to your pain.  Your lab work today looks good as well.  Use ibuprofen 800 mg 3 times daily, plus Tylenol as needed to treat pain, you can also use warm compresses over the area.  It is very important that she follow-up with gynecology regarding the symptoms.  Return to the ED if you have new or worsening pain, or any other concerning symptoms.

## 2020-12-13 DEATH — deceased

## 2021-06-01 IMAGING — CT CT ABD-PELV W/ CM
2 of 4 series · 16 of 46 positions shown, 18 images · IV contrast (OMNIPAQUE 300)
Comparison: Abdominal radiograph dated 01/18/2010.

CLINICAL DATA: Right lower quadrant tenderness

EXAM:
CT ABDOMEN AND PELVIS WITH CONTRAST
TECHNIQUE: Multidetector CT imaging of the abdomen and pelvis was performed
using the standard protocol following bolus administration of
intravenous contrast.
CONTRAST:  100mL OMNIPAQUE IOHEXOL 300 MG/ML  SOLN

[Series 2: axial st · axial · 0.70mm/px · z∈[-402,+2]mm · 13 of 91 slices shown, 15 images]
[im 5/91  soft-tissue]
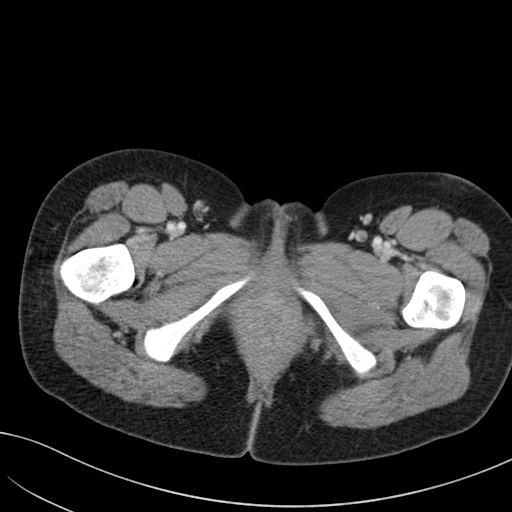
[im 5/91  bone]
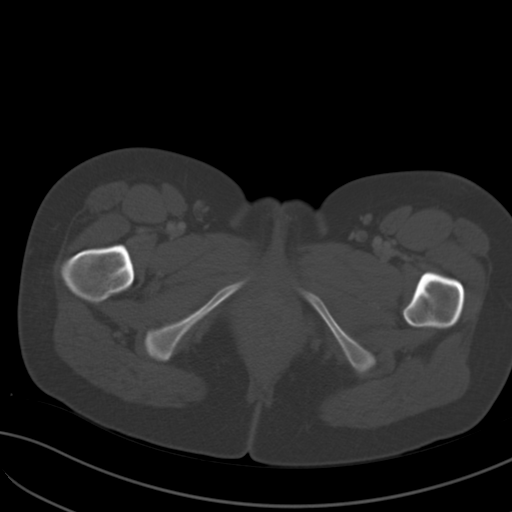
[im 15/91  soft-tissue]
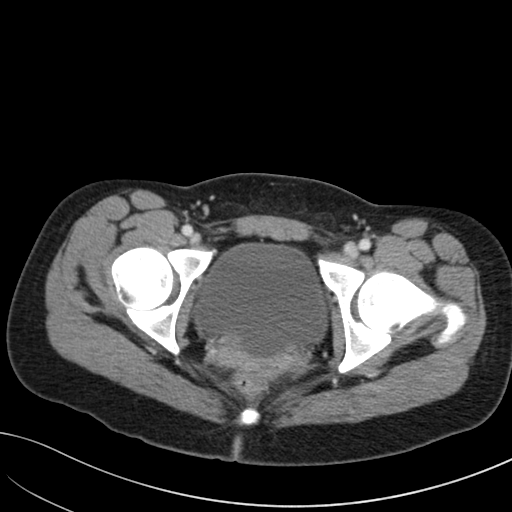
[im 19/91  soft-tissue]
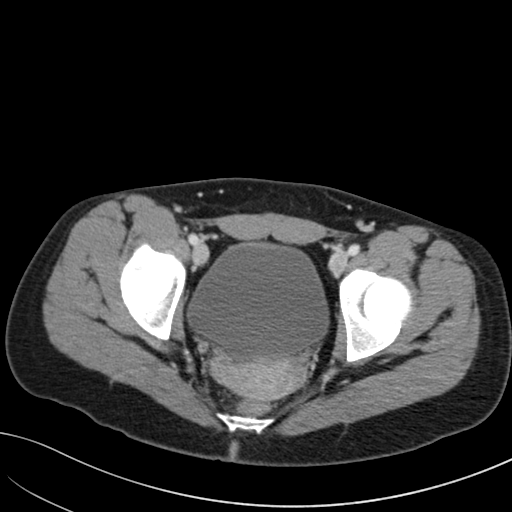
[im 24/91  soft-tissue]
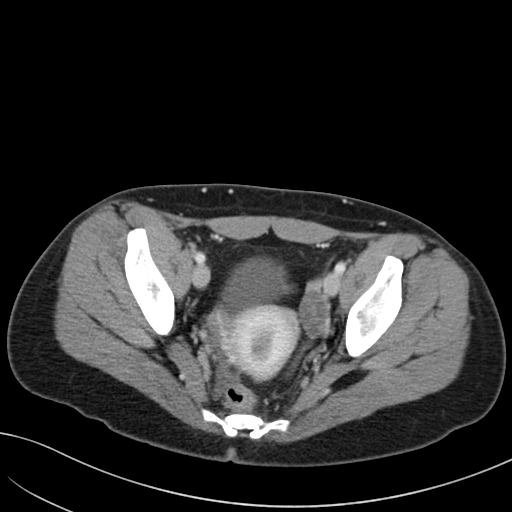
[im 34/91  soft-tissue]
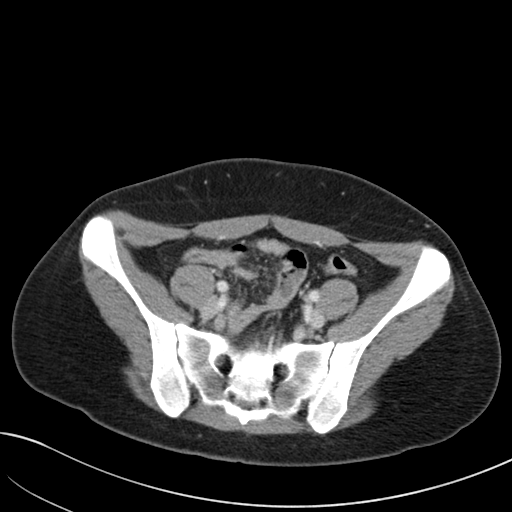
[im 38/91  soft-tissue]
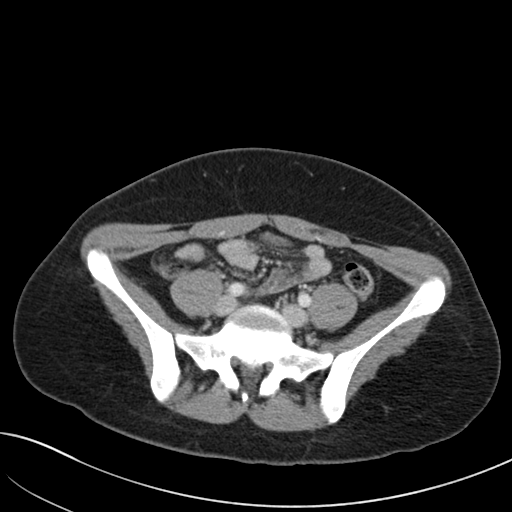
[im 48/91  soft-tissue]
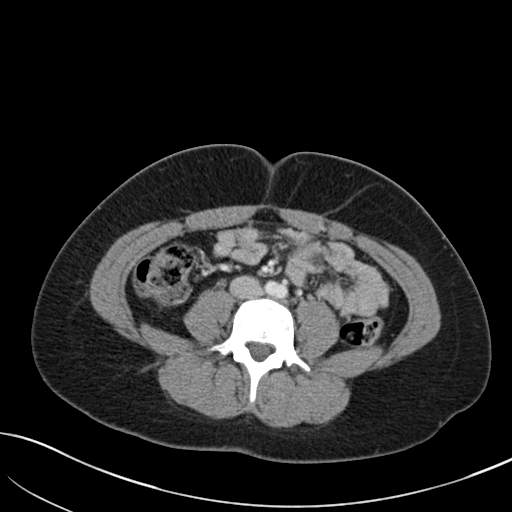
[im 53/91  soft-tissue]
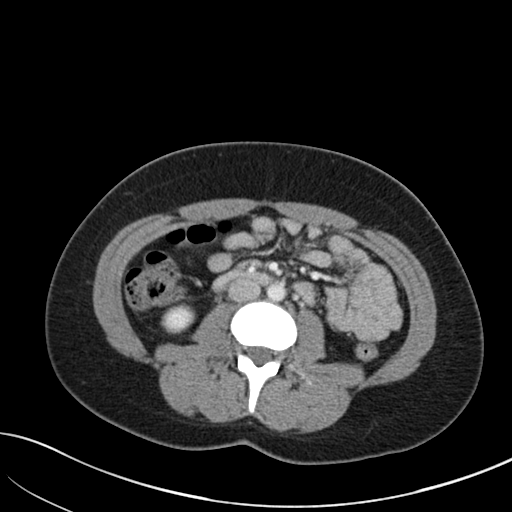
[im 57/91  soft-tissue]
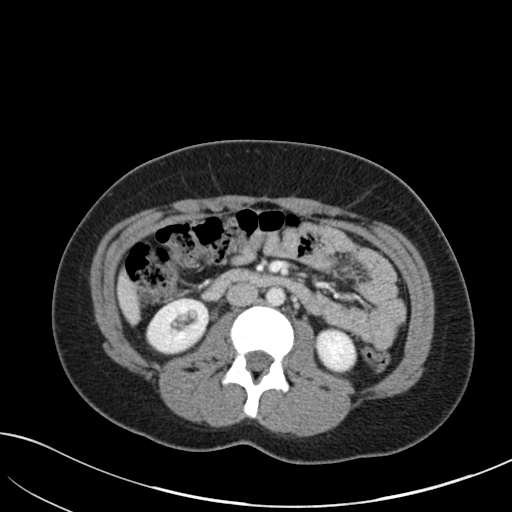
[im 57/91  bone]
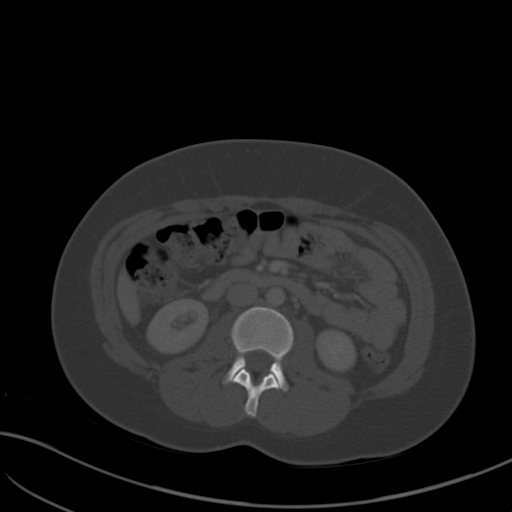
[im 67/91  soft-tissue]
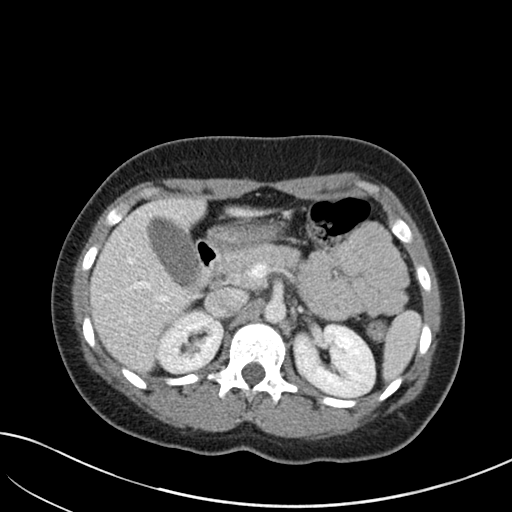
[im 72/91  soft-tissue]
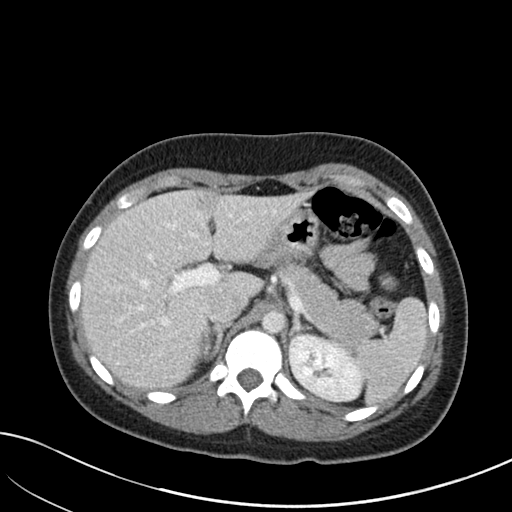
[im 76/91  soft-tissue]
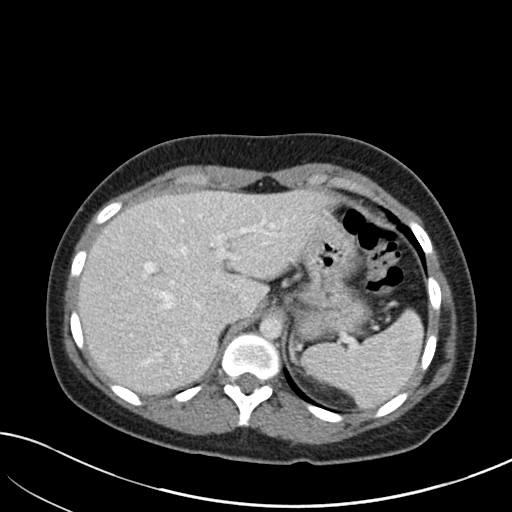
[im 86/91  soft-tissue]
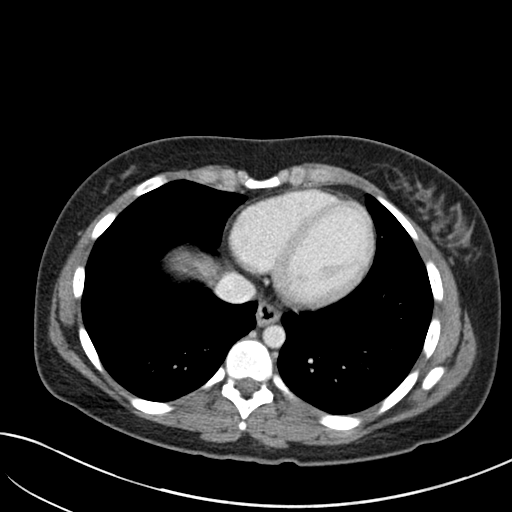

[Series 4: coronal st · coronal · 0.80mm/px · 3 of 93 slices shown]
[im 31/93  soft-tissue]
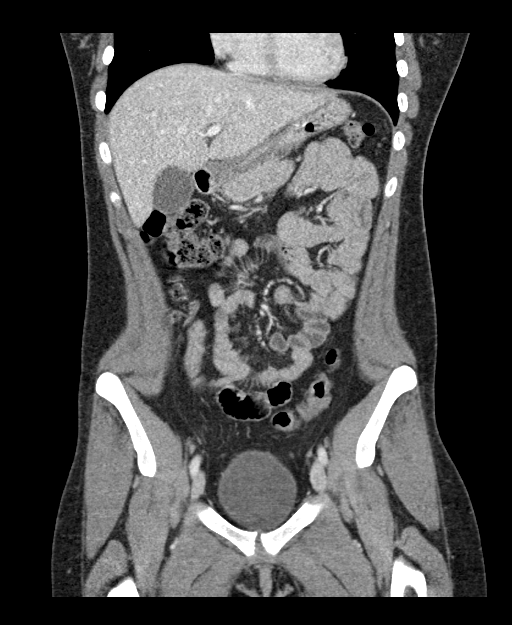
[im 41/93  soft-tissue]
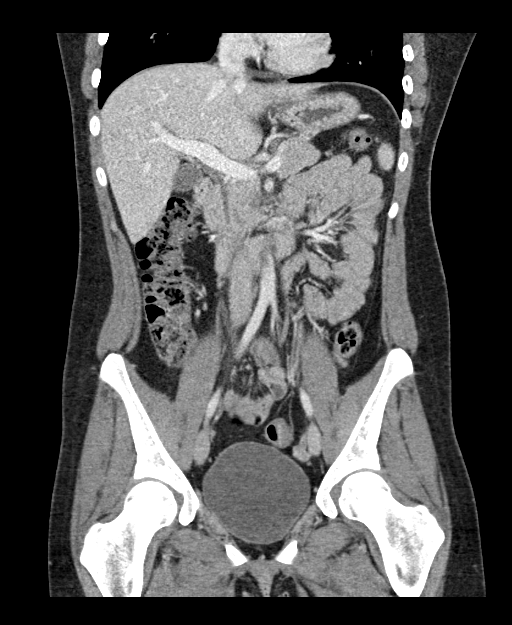
[im 52/93  soft-tissue]
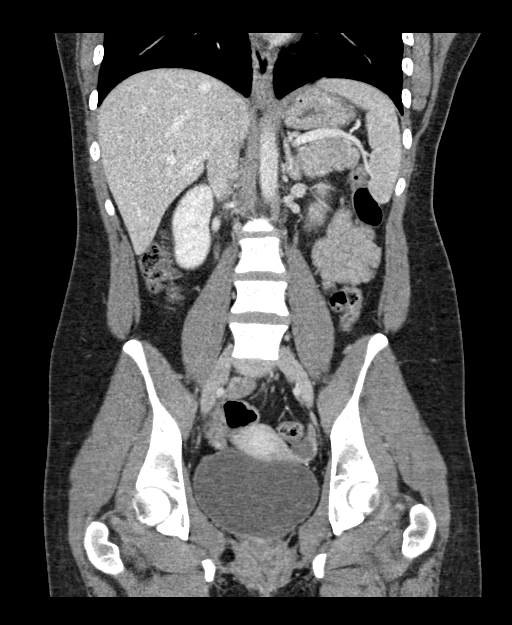

[16 of 46 positions shown; findings below may reference images not displayed]

FINDINGS: Lower chest: No acute abnormality.

Hepatobiliary: No focal liver abnormality is seen. No gallstones,
gallbladder wall thickening, or biliary dilatation.

Pancreas: Unremarkable. No pancreatic ductal dilatation or
surrounding inflammatory changes.

Spleen: Normal in size without focal abnormality.

Adrenals/Urinary Tract: Adrenal glands are unremarkable. Kidneys are
normal, without renal calculi, focal lesion, or hydronephrosis.
Bladder is unremarkable.

Stomach/Bowel: Stomach is within normal limits. Appendix appears
normal. No evidence of bowel wall thickening, distention, or
inflammatory changes.

Vascular/Lymphatic: No significant vascular findings are present. No
enlarged abdominal or pelvic lymph nodes.

Reproductive: Uterus and bilateral adnexa are unremarkable.

Other: No abdominal wall hernia or abnormality. No abdominopelvic
ascites.

Musculoskeletal: No acute or significant osseous findings.
IMPRESSION: No acute process in the abdomen or pelvis.

## 2021-08-31 IMAGING — US US PELVIS COMPLETE TRANSABD/TRANSVAG W DUPLEX
1 series · 13 of 25 positions shown · non-contrast
Comparison: 06/26/2019

CLINICAL DATA: Right-sided pelvic pain with history of ovarian
cysts, rule out torsion

EXAM:
TRANSABDOMINAL AND TRANSVAGINAL ULTRASOUND OF PELVIS
DOPPLER ULTRASOUND OF OVARIES
TECHNIQUE: Both transabdominal and transvaginal ultrasound examinations of the
pelvis were performed. Transabdominal technique was performed for
global imaging of the pelvis including uterus, ovaries, adnexal
regions, and pelvic cul-de-sac.
It was necessary to proceed with endovaginal exam following the
transabdominal exam to visualize the ovaries. Color and duplex
Doppler ultrasound was utilized to evaluate blood flow to the
ovaries.

[Series 1: us pelvis complete transabd/transvag w duplex · 13 of 67 slices shown]
[im 1/67]
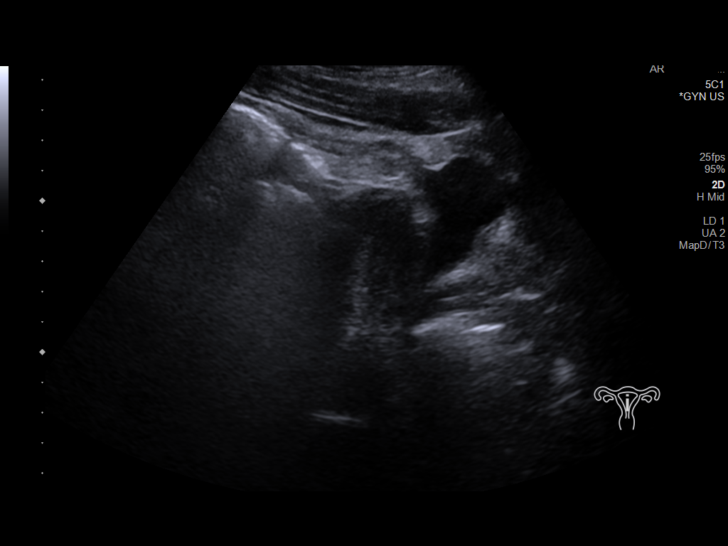
[im 6/67]
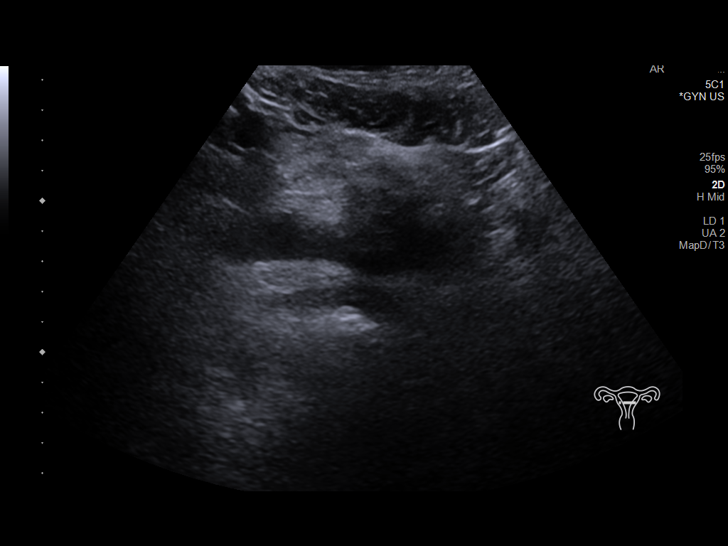
[im 12/67]
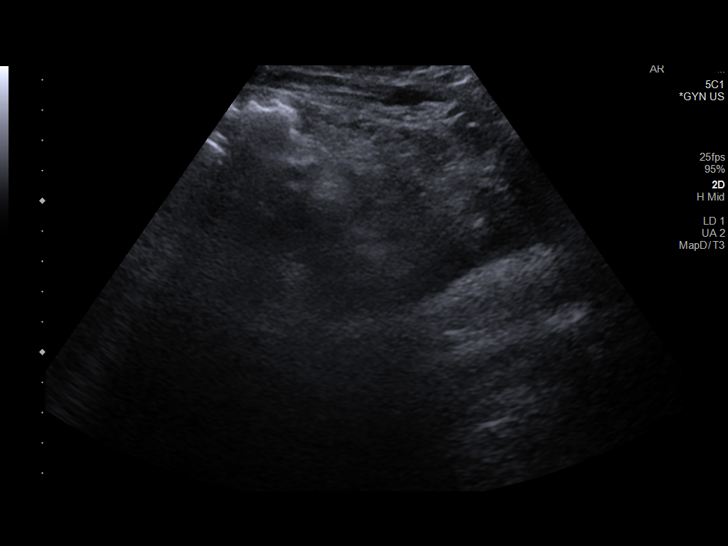
[im 17/67]
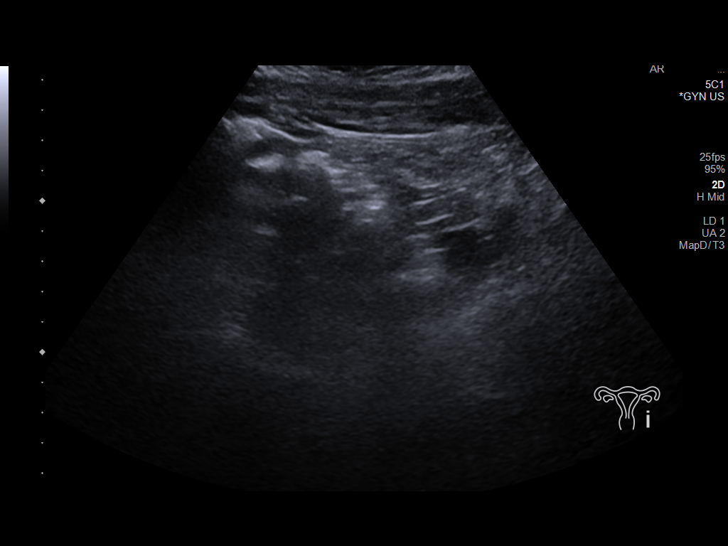
[im 23/67]
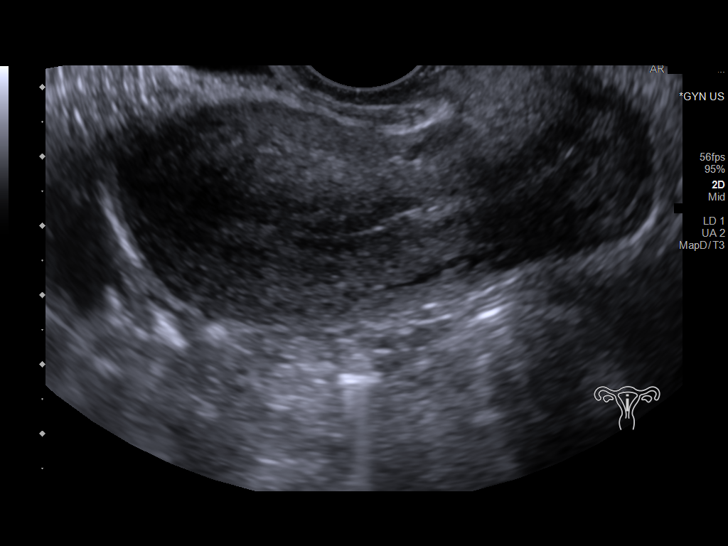
[im 28/67]
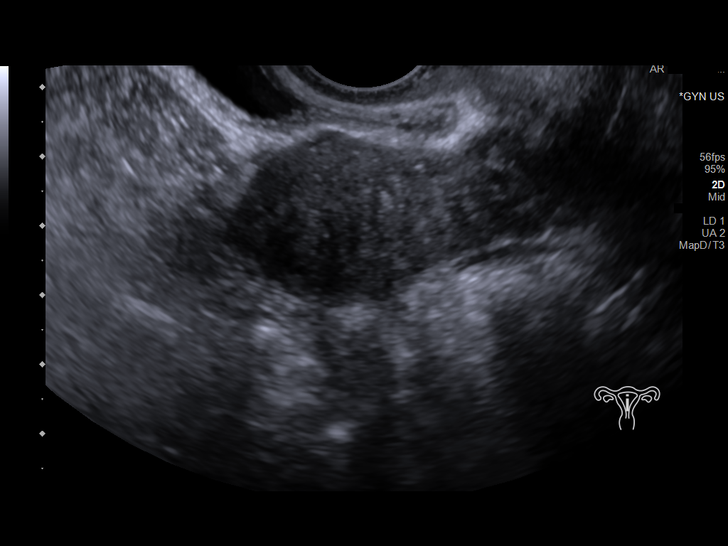
[im 34/67]
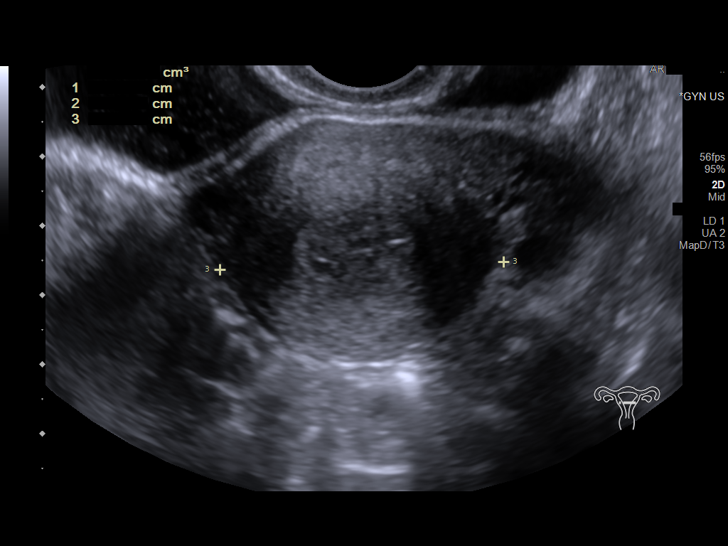
[im 39/67]
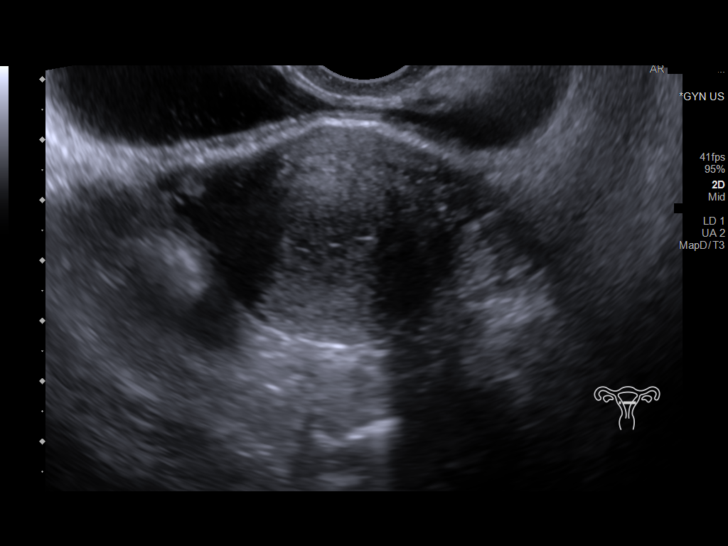
[im 45/67]
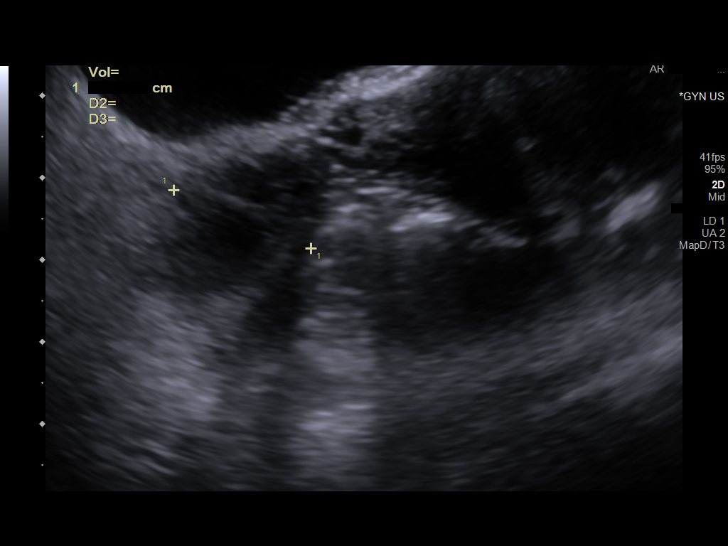
[im 50/67]
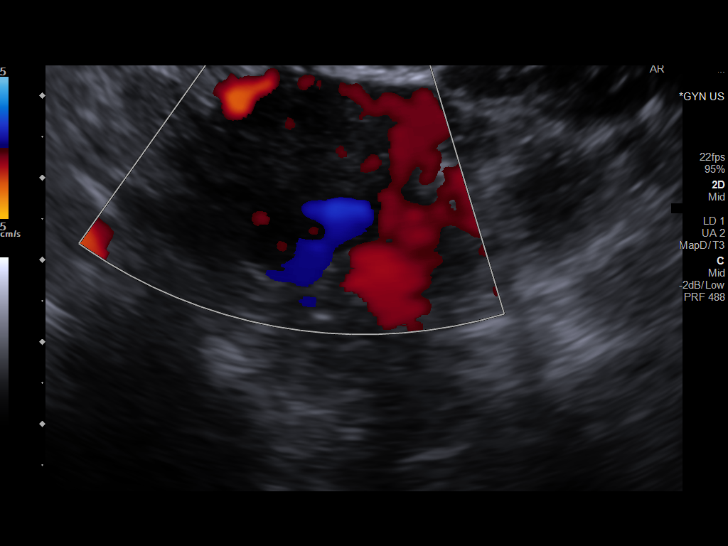
[im 56/67]
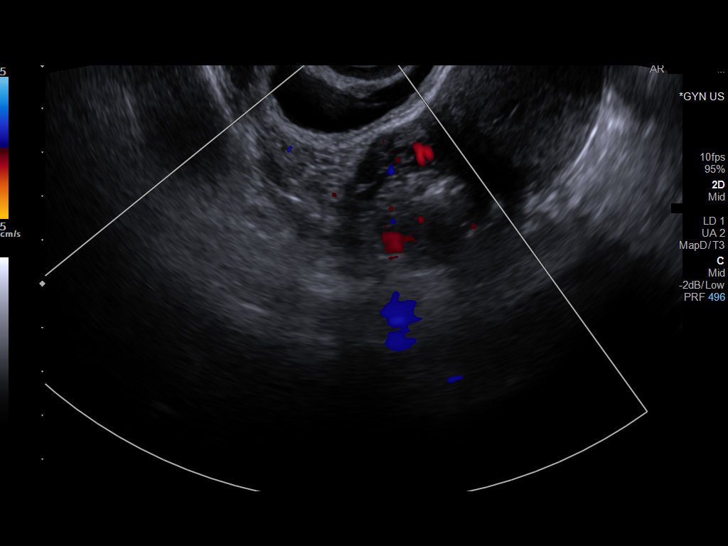
[im 61/67]
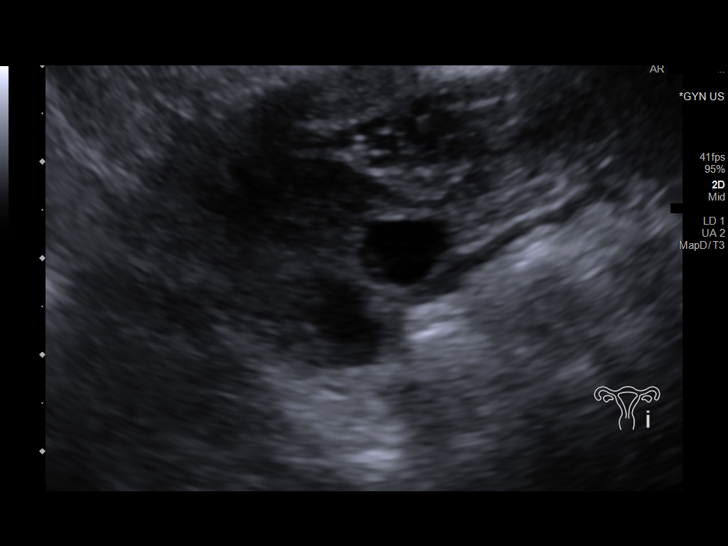
[im 67/67]
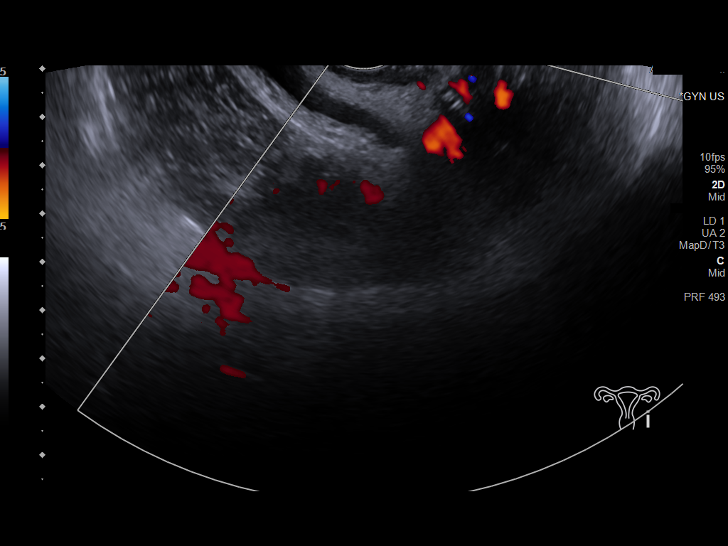

[13 of 25 positions shown; findings below may reference images not displayed]

FINDINGS: Uterus

Measurements: 8 x 3 x 4 cm = volume: 55 mL. No fibroids or other
mass visualized.

Endometrium

Thickness: 2 mm.  No focal abnormality visualized.

Right ovary

Measurements: 31 x 25 x 18 mm = volume: 7.4 mL. Normal appearance/no
adnexal mass.

Left ovary

Measurements: 26 x 23 x 16 mm = volume: 4.8 mL. Normal appearance/no
adnexal mass.

Pulsed Doppler evaluation of both ovaries demonstrates normal
low-resistance arterial and venous waveforms.

Other findings

No abnormal free fluid.
IMPRESSION: Negative pelvic ultrasound.  No explanation for pain.
